# Patient Record
Sex: Female | Born: 1940 | Race: White | Hispanic: No | Marital: Single
Health system: Southern US, Community
[De-identification: ages and names within clinical notes are randomized; demographics above are authoritative.]

## PROBLEM LIST (undated history)

## (undated) DIAGNOSIS — Z930 Tracheostomy status: Secondary | ICD-10-CM

## (undated) DIAGNOSIS — R609 Edema, unspecified: Secondary | ICD-10-CM

## (undated) DIAGNOSIS — D62 Acute posthemorrhagic anemia: Secondary | ICD-10-CM

## (undated) DIAGNOSIS — Z931 Gastrostomy status: Secondary | ICD-10-CM

## (undated) DIAGNOSIS — I5030 Unspecified diastolic (congestive) heart failure: Secondary | ICD-10-CM

## (undated) DIAGNOSIS — I4891 Unspecified atrial fibrillation: Secondary | ICD-10-CM

## (undated) DIAGNOSIS — A419 Sepsis, unspecified organism: Secondary | ICD-10-CM

## (undated) DIAGNOSIS — R5381 Other malaise: Secondary | ICD-10-CM

## (undated) DIAGNOSIS — N189 Chronic kidney disease, unspecified: Secondary | ICD-10-CM

## (undated) DIAGNOSIS — I2510 Atherosclerotic heart disease of native coronary artery without angina pectoris: Secondary | ICD-10-CM

## (undated) DIAGNOSIS — S32009A Unspecified fracture of unspecified lumbar vertebra, initial encounter for closed fracture: Secondary | ICD-10-CM

## (undated) DIAGNOSIS — I219 Acute myocardial infarction, unspecified: Secondary | ICD-10-CM

## (undated) DIAGNOSIS — I10 Essential (primary) hypertension: Secondary | ICD-10-CM

## (undated) DIAGNOSIS — J9620 Acute and chronic respiratory failure, unspecified whether with hypoxia or hypercapnia: Secondary | ICD-10-CM

## (undated) DIAGNOSIS — S301XXA Contusion of abdominal wall, initial encounter: Secondary | ICD-10-CM

## (undated) DIAGNOSIS — J189 Pneumonia, unspecified organism: Secondary | ICD-10-CM

## (undated) DIAGNOSIS — M4850XA Collapsed vertebra, not elsewhere classified, site unspecified, initial encounter for fracture: Secondary | ICD-10-CM

## (undated) DIAGNOSIS — J95821 Acute postprocedural respiratory failure: Secondary | ICD-10-CM

## (undated) DIAGNOSIS — E870 Hyperosmolality and hypernatremia: Secondary | ICD-10-CM

## (undated) DIAGNOSIS — R531 Weakness: Secondary | ICD-10-CM

## (undated) DIAGNOSIS — A0472 Enterocolitis due to Clostridium difficile, not specified as recurrent: Secondary | ICD-10-CM

## (undated) HISTORY — PX: OPEN REDUCTION OF HIP: SHX5993

## (undated) HISTORY — PX: OTHER SURGICAL HISTORY: SHX169

---

## 2014-08-23 ENCOUNTER — Inpatient Hospital Stay (HOSPITAL_COMMUNITY)
Admission: EM | Admit: 2014-08-23 | Discharge: 2014-09-18 | DRG: 870 | Disposition: E | Payer: Medicare Other | Source: Other Acute Inpatient Hospital | Attending: Internal Medicine | Admitting: Internal Medicine

## 2014-08-23 ENCOUNTER — Emergency Department (HOSPITAL_COMMUNITY): Payer: Medicare Other

## 2014-08-23 DIAGNOSIS — I2510 Atherosclerotic heart disease of native coronary artery without angina pectoris: Secondary | ICD-10-CM | POA: Diagnosis present

## 2014-08-23 DIAGNOSIS — R739 Hyperglycemia, unspecified: Secondary | ICD-10-CM | POA: Diagnosis not present

## 2014-08-23 DIAGNOSIS — R579 Shock, unspecified: Secondary | ICD-10-CM

## 2014-08-23 DIAGNOSIS — Z87891 Personal history of nicotine dependence: Secondary | ICD-10-CM

## 2014-08-23 DIAGNOSIS — E46 Unspecified protein-calorie malnutrition: Secondary | ICD-10-CM | POA: Diagnosis not present

## 2014-08-23 DIAGNOSIS — I252 Old myocardial infarction: Secondary | ICD-10-CM | POA: Diagnosis not present

## 2014-08-23 DIAGNOSIS — Z931 Gastrostomy status: Secondary | ICD-10-CM

## 2014-08-23 DIAGNOSIS — J9621 Acute and chronic respiratory failure with hypoxia: Secondary | ICD-10-CM | POA: Diagnosis not present

## 2014-08-23 DIAGNOSIS — F0781 Postconcussional syndrome: Secondary | ICD-10-CM

## 2014-08-23 DIAGNOSIS — R5381 Other malaise: Secondary | ICD-10-CM | POA: Diagnosis present

## 2014-08-23 DIAGNOSIS — N179 Acute kidney failure, unspecified: Secondary | ICD-10-CM | POA: Diagnosis not present

## 2014-08-23 DIAGNOSIS — Z85118 Personal history of other malignant neoplasm of bronchus and lung: Secondary | ICD-10-CM

## 2014-08-23 DIAGNOSIS — Z23 Encounter for immunization: Secondary | ICD-10-CM | POA: Diagnosis not present

## 2014-08-23 DIAGNOSIS — L89010 Pressure ulcer of right elbow, unstageable: Secondary | ICD-10-CM | POA: Diagnosis not present

## 2014-08-23 DIAGNOSIS — E872 Acidosis: Secondary | ICD-10-CM | POA: Diagnosis not present

## 2014-08-23 DIAGNOSIS — I5032 Chronic diastolic (congestive) heart failure: Secondary | ICD-10-CM | POA: Diagnosis not present

## 2014-08-23 DIAGNOSIS — Z1624 Resistance to multiple antibiotics: Secondary | ICD-10-CM | POA: Diagnosis not present

## 2014-08-23 DIAGNOSIS — Z930 Tracheostomy status: Secondary | ICD-10-CM

## 2014-08-23 DIAGNOSIS — E876 Hypokalemia: Secondary | ICD-10-CM | POA: Diagnosis not present

## 2014-08-23 DIAGNOSIS — A419 Sepsis, unspecified organism: Secondary | ICD-10-CM | POA: Diagnosis not present

## 2014-08-23 DIAGNOSIS — E870 Hyperosmolality and hypernatremia: Secondary | ICD-10-CM | POA: Diagnosis not present

## 2014-08-23 DIAGNOSIS — N189 Chronic kidney disease, unspecified: Secondary | ICD-10-CM | POA: Diagnosis not present

## 2014-08-23 DIAGNOSIS — I129 Hypertensive chronic kidney disease with stage 1 through stage 4 chronic kidney disease, or unspecified chronic kidney disease: Secondary | ICD-10-CM | POA: Diagnosis not present

## 2014-08-23 DIAGNOSIS — Z7982 Long term (current) use of aspirin: Secondary | ICD-10-CM | POA: Diagnosis not present

## 2014-08-23 DIAGNOSIS — R6521 Severe sepsis with septic shock: Secondary | ICD-10-CM | POA: Diagnosis not present

## 2014-08-23 DIAGNOSIS — J9620 Acute and chronic respiratory failure, unspecified whether with hypoxia or hypercapnia: Secondary | ICD-10-CM

## 2014-08-23 DIAGNOSIS — J189 Pneumonia, unspecified organism: Secondary | ICD-10-CM

## 2014-08-23 DIAGNOSIS — A498 Other bacterial infections of unspecified site: Secondary | ICD-10-CM

## 2014-08-23 DIAGNOSIS — Z515 Encounter for palliative care: Secondary | ICD-10-CM | POA: Diagnosis not present

## 2014-08-23 DIAGNOSIS — Z79899 Other long term (current) drug therapy: Secondary | ICD-10-CM | POA: Diagnosis not present

## 2014-08-23 DIAGNOSIS — A499 Bacterial infection, unspecified: Secondary | ICD-10-CM

## 2014-08-23 DIAGNOSIS — Z9911 Dependence on respirator [ventilator] status: Secondary | ICD-10-CM | POA: Diagnosis not present

## 2014-08-23 DIAGNOSIS — Z6841 Body Mass Index (BMI) 40.0 and over, adult: Secondary | ICD-10-CM

## 2014-08-23 DIAGNOSIS — B9689 Other specified bacterial agents as the cause of diseases classified elsewhere: Secondary | ICD-10-CM | POA: Diagnosis present

## 2014-08-23 DIAGNOSIS — J449 Chronic obstructive pulmonary disease, unspecified: Secondary | ICD-10-CM | POA: Diagnosis not present

## 2014-08-23 DIAGNOSIS — E875 Hyperkalemia: Secondary | ICD-10-CM | POA: Diagnosis present

## 2014-08-23 DIAGNOSIS — R6520 Severe sepsis without septic shock: Secondary | ICD-10-CM | POA: Diagnosis present

## 2014-08-23 DIAGNOSIS — Z66 Do not resuscitate: Secondary | ICD-10-CM

## 2014-08-23 DIAGNOSIS — I4891 Unspecified atrial fibrillation: Secondary | ICD-10-CM | POA: Diagnosis present

## 2014-08-23 DIAGNOSIS — R042 Hemoptysis: Secondary | ICD-10-CM

## 2014-08-23 DIAGNOSIS — D638 Anemia in other chronic diseases classified elsewhere: Secondary | ICD-10-CM | POA: Diagnosis not present

## 2014-08-23 DIAGNOSIS — N170 Acute kidney failure with tubular necrosis: Secondary | ICD-10-CM

## 2014-08-23 DIAGNOSIS — R0602 Shortness of breath: Secondary | ICD-10-CM | POA: Diagnosis not present

## 2014-08-23 HISTORY — DX: Chronic kidney disease, unspecified: N18.9

## 2014-08-23 HISTORY — DX: Edema, unspecified: R60.9

## 2014-08-23 HISTORY — DX: Weakness: R53.1

## 2014-08-23 HISTORY — DX: Unspecified atrial fibrillation: I48.91

## 2014-08-23 HISTORY — DX: Acute and chronic respiratory failure, unspecified whether with hypoxia or hypercapnia: J96.20

## 2014-08-23 HISTORY — DX: Atherosclerotic heart disease of native coronary artery without angina pectoris: I25.10

## 2014-08-23 HISTORY — DX: Unspecified fracture of unspecified lumbar vertebra, initial encounter for closed fracture: S32.009A

## 2014-08-23 HISTORY — DX: Sepsis, unspecified organism: A41.9

## 2014-08-23 HISTORY — DX: Collapsed vertebra, not elsewhere classified, site unspecified, initial encounter for fracture: M48.50XA

## 2014-08-23 HISTORY — DX: Acute postprocedural respiratory failure: J95.821

## 2014-08-23 HISTORY — DX: Unspecified diastolic (congestive) heart failure: I50.30

## 2014-08-23 HISTORY — DX: Essential (primary) hypertension: I10

## 2014-08-23 HISTORY — DX: Acute myocardial infarction, unspecified: I21.9

## 2014-08-23 HISTORY — DX: Gastrostomy status: Z93.1

## 2014-08-23 HISTORY — DX: Contusion of abdominal wall, initial encounter: S30.1XXA

## 2014-08-23 HISTORY — DX: Hyperosmolality and hypernatremia: E87.0

## 2014-08-23 HISTORY — DX: Pneumonia, unspecified organism: J18.9

## 2014-08-23 HISTORY — DX: Tracheostomy status: Z93.0

## 2014-08-23 HISTORY — DX: Enterocolitis due to Clostridium difficile, not specified as recurrent: A04.72

## 2014-08-23 HISTORY — DX: Other malaise: R53.81

## 2014-08-23 HISTORY — DX: Acute posthemorrhagic anemia: D62

## 2014-08-23 MED ORDER — SODIUM CHLORIDE 0.9 % IV BOLUS (SEPSIS)
30.0000 mL/kg | Freq: Once | INTRAVENOUS | Status: AC
  Administered 2014-08-24: 3490 mL via INTRAVENOUS

## 2014-08-23 MED ORDER — VANCOMYCIN HCL 10 G IV SOLR
1500.0000 mg | Freq: Once | INTRAVENOUS | Status: AC
  Administered 2014-08-24: 1500 mg via INTRAVENOUS
  Filled 2014-08-23: qty 1500

## 2014-08-23 MED ORDER — SODIUM CHLORIDE 0.9 % IV SOLN: 1000.0000 mL | INTRAVENOUS | Status: DC

## 2014-08-23 MED ORDER — PIPERACILLIN-TAZOBACTAM 3.375 G IVPB 30 MIN
3.3750 g | Freq: Once | INTRAVENOUS | Status: AC
  Administered 2014-08-24: 3.375 g via INTRAVENOUS
  Filled 2014-08-23: qty 50

## 2014-08-24 ENCOUNTER — Encounter (HOSPITAL_COMMUNITY): Payer: Self-pay | Admitting: Emergency Medicine

## 2014-08-24 ENCOUNTER — Inpatient Hospital Stay (HOSPITAL_COMMUNITY): Payer: Medicare Other

## 2014-08-24 DIAGNOSIS — R6521 Severe sepsis with septic shock: Secondary | ICD-10-CM | POA: Diagnosis present

## 2014-08-24 DIAGNOSIS — Z931 Gastrostomy status: Secondary | ICD-10-CM | POA: Diagnosis not present

## 2014-08-24 DIAGNOSIS — Z515 Encounter for palliative care: Secondary | ICD-10-CM | POA: Diagnosis not present

## 2014-08-24 DIAGNOSIS — J9621 Acute and chronic respiratory failure with hypoxia: Secondary | ICD-10-CM | POA: Diagnosis present

## 2014-08-24 DIAGNOSIS — Z1624 Resistance to multiple antibiotics: Secondary | ICD-10-CM

## 2014-08-24 DIAGNOSIS — R579 Shock, unspecified: Secondary | ICD-10-CM

## 2014-08-24 DIAGNOSIS — N179 Acute kidney failure, unspecified: Secondary | ICD-10-CM

## 2014-08-24 DIAGNOSIS — A499 Bacterial infection, unspecified: Secondary | ICD-10-CM

## 2014-08-24 DIAGNOSIS — E872 Acidosis: Secondary | ICD-10-CM | POA: Diagnosis present

## 2014-08-24 DIAGNOSIS — R739 Hyperglycemia, unspecified: Secondary | ICD-10-CM | POA: Diagnosis present

## 2014-08-24 DIAGNOSIS — F0781 Postconcussional syndrome: Secondary | ICD-10-CM | POA: Diagnosis present

## 2014-08-24 DIAGNOSIS — I252 Old myocardial infarction: Secondary | ICD-10-CM | POA: Diagnosis not present

## 2014-08-24 DIAGNOSIS — Z66 Do not resuscitate: Secondary | ICD-10-CM

## 2014-08-24 DIAGNOSIS — I2510 Atherosclerotic heart disease of native coronary artery without angina pectoris: Secondary | ICD-10-CM | POA: Diagnosis present

## 2014-08-24 DIAGNOSIS — L89010 Pressure ulcer of right elbow, unstageable: Secondary | ICD-10-CM | POA: Diagnosis present

## 2014-08-24 DIAGNOSIS — Z23 Encounter for immunization: Secondary | ICD-10-CM | POA: Diagnosis not present

## 2014-08-24 DIAGNOSIS — I5032 Chronic diastolic (congestive) heart failure: Secondary | ICD-10-CM | POA: Diagnosis present

## 2014-08-24 DIAGNOSIS — J9620 Acute and chronic respiratory failure, unspecified whether with hypoxia or hypercapnia: Secondary | ICD-10-CM

## 2014-08-24 DIAGNOSIS — J189 Pneumonia, unspecified organism: Secondary | ICD-10-CM | POA: Diagnosis present

## 2014-08-24 DIAGNOSIS — Z6841 Body Mass Index (BMI) 40.0 and over, adult: Secondary | ICD-10-CM | POA: Diagnosis not present

## 2014-08-24 DIAGNOSIS — Z79899 Other long term (current) drug therapy: Secondary | ICD-10-CM | POA: Diagnosis not present

## 2014-08-24 DIAGNOSIS — Z85118 Personal history of other malignant neoplasm of bronchus and lung: Secondary | ICD-10-CM | POA: Diagnosis not present

## 2014-08-24 DIAGNOSIS — I4891 Unspecified atrial fibrillation: Secondary | ICD-10-CM | POA: Diagnosis present

## 2014-08-24 DIAGNOSIS — N189 Chronic kidney disease, unspecified: Secondary | ICD-10-CM | POA: Diagnosis present

## 2014-08-24 DIAGNOSIS — Z87891 Personal history of nicotine dependence: Secondary | ICD-10-CM | POA: Diagnosis not present

## 2014-08-24 DIAGNOSIS — Z930 Tracheostomy status: Secondary | ICD-10-CM

## 2014-08-24 DIAGNOSIS — D638 Anemia in other chronic diseases classified elsewhere: Secondary | ICD-10-CM | POA: Diagnosis present

## 2014-08-24 DIAGNOSIS — R0602 Shortness of breath: Secondary | ICD-10-CM | POA: Diagnosis present

## 2014-08-24 DIAGNOSIS — E876 Hypokalemia: Secondary | ICD-10-CM | POA: Diagnosis not present

## 2014-08-24 DIAGNOSIS — E875 Hyperkalemia: Secondary | ICD-10-CM | POA: Diagnosis present

## 2014-08-24 DIAGNOSIS — J449 Chronic obstructive pulmonary disease, unspecified: Secondary | ICD-10-CM | POA: Diagnosis present

## 2014-08-24 DIAGNOSIS — E870 Hyperosmolality and hypernatremia: Secondary | ICD-10-CM | POA: Diagnosis not present

## 2014-08-24 DIAGNOSIS — R5381 Other malaise: Secondary | ICD-10-CM | POA: Diagnosis present

## 2014-08-24 DIAGNOSIS — B9689 Other specified bacterial agents as the cause of diseases classified elsewhere: Secondary | ICD-10-CM | POA: Diagnosis present

## 2014-08-24 DIAGNOSIS — Z7982 Long term (current) use of aspirin: Secondary | ICD-10-CM | POA: Diagnosis not present

## 2014-08-24 DIAGNOSIS — A419 Sepsis, unspecified organism: Secondary | ICD-10-CM | POA: Diagnosis present

## 2014-08-24 DIAGNOSIS — Z9911 Dependence on respirator [ventilator] status: Secondary | ICD-10-CM | POA: Diagnosis not present

## 2014-08-24 DIAGNOSIS — I129 Hypertensive chronic kidney disease with stage 1 through stage 4 chronic kidney disease, or unspecified chronic kidney disease: Secondary | ICD-10-CM | POA: Diagnosis present

## 2014-08-24 DIAGNOSIS — E46 Unspecified protein-calorie malnutrition: Secondary | ICD-10-CM | POA: Diagnosis present

## 2014-08-24 DIAGNOSIS — R6520 Severe sepsis without septic shock: Secondary | ICD-10-CM

## 2014-08-24 LAB — I-STAT ARTERIAL BLOOD GAS, ED
Acid-Base Excess: 1 mmol/L (ref 0.0–2.0)
Acid-Base Excess: 1 mmol/L (ref 0.0–2.0)
Acid-base deficit: 1 mmol/L (ref 0.0–2.0)
Bicarbonate: 26.4 mEq/L — ABNORMAL HIGH (ref 20.0–24.0)
Bicarbonate: 29.6 mEq/L — ABNORMAL HIGH (ref 20.0–24.0)
Bicarbonate: 30.1 mEq/L — ABNORMAL HIGH (ref 20.0–24.0)
Bicarbonate: 30.5 mEq/L — ABNORMAL HIGH (ref 20.0–24.0)
O2 SAT: 66 %
O2 SAT: 72 %
O2 SAT: 83 %
O2 Saturation: 89 %
PCO2 ART: 67.3 mmHg — AB (ref 35.0–45.0)
PH ART: 7.251 — AB (ref 7.350–7.450)
PH ART: 7.258 — AB (ref 7.350–7.450)
PO2 ART: 56 mmHg — AB (ref 80.0–100.0)
Patient temperature: 98.7
Patient temperature: 98.7
Patient temperature: 98.7
TCO2: 28 mmol/L (ref 0–100)
TCO2: 32 mmol/L (ref 0–100)
TCO2: 32 mmol/L (ref 0–100)
TCO2: 33 mmol/L (ref 0–100)
pCO2 arterial: 59.1 mmHg (ref 35.0–45.0)
pCO2 arterial: 74.3 mmHg (ref 35.0–45.0)
pCO2 arterial: 85.2 mmHg (ref 35.0–45.0)
pH, Arterial: 7.162 — CL (ref 7.350–7.450)
pH, Arterial: 7.216 — ABNORMAL LOW (ref 7.350–7.450)
pO2, Arterial: 46 mmHg — ABNORMAL LOW (ref 80.0–100.0)
pO2, Arterial: 48 mmHg — ABNORMAL LOW (ref 80.0–100.0)
pO2, Arterial: 67 mmHg — ABNORMAL LOW (ref 80.0–100.0)

## 2014-08-24 LAB — BASIC METABOLIC PANEL
ANION GAP: 15 (ref 5–15)
ANION GAP: 17 — AB (ref 5–15)
Anion gap: 14 (ref 5–15)
BUN: 102 mg/dL — ABNORMAL HIGH (ref 6–23)
BUN: 102 mg/dL — ABNORMAL HIGH (ref 6–23)
BUN: 104 mg/dL — ABNORMAL HIGH (ref 6–23)
CALCIUM: 8.6 mg/dL (ref 8.4–10.5)
CHLORIDE: 93 meq/L — AB (ref 96–112)
CHLORIDE: 95 meq/L — AB (ref 96–112)
CO2: 25 mEq/L (ref 19–32)
CO2: 25 mEq/L (ref 19–32)
CO2: 25 mEq/L (ref 19–32)
CREATININE: 1.56 mg/dL — AB (ref 0.50–1.10)
CREATININE: 1.62 mg/dL — AB (ref 0.50–1.10)
Calcium: 8.2 mg/dL — ABNORMAL LOW (ref 8.4–10.5)
Calcium: 8.3 mg/dL — ABNORMAL LOW (ref 8.4–10.5)
Chloride: 93 mEq/L — ABNORMAL LOW (ref 96–112)
Creatinine, Ser: 1.56 mg/dL — ABNORMAL HIGH (ref 0.50–1.10)
GFR calc Af Amer: 35 mL/min — ABNORMAL LOW (ref >90)
GFR calc non Af Amer: 30 mL/min — ABNORMAL LOW (ref >90)
GFR, EST AFRICAN AMERICAN: 37 mL/min — AB (ref >90)
GFR, EST AFRICAN AMERICAN: 37 mL/min — AB (ref >90)
GFR, EST NON AFRICAN AMERICAN: 32 mL/min — AB (ref >90)
GFR, EST NON AFRICAN AMERICAN: 32 mL/min — AB (ref >90)
Glucose, Bld: 106 mg/dL — ABNORMAL HIGH (ref 70–99)
Glucose, Bld: 107 mg/dL — ABNORMAL HIGH (ref 70–99)
Glucose, Bld: 137 mg/dL — ABNORMAL HIGH (ref 70–99)
POTASSIUM: 5.9 meq/L — AB (ref 3.7–5.3)
Potassium: 5.9 mEq/L — ABNORMAL HIGH (ref 3.7–5.3)
Potassium: 5.6 mEq/L — ABNORMAL HIGH (ref 3.7–5.3)
SODIUM: 133 meq/L — AB (ref 137–147)
Sodium: 132 mEq/L — ABNORMAL LOW (ref 137–147)
Sodium: 137 mEq/L (ref 137–147)

## 2014-08-24 LAB — CBC WITH DIFFERENTIAL/PLATELET
BASOS PCT: 0 % (ref 0–1)
Basophils Absolute: 0 10*3/uL (ref 0.0–0.1)
EOS ABS: 0 10*3/uL (ref 0.0–0.7)
Eosinophils Relative: 0 % (ref 0–5)
HEMATOCRIT: 22 % — AB (ref 36.0–46.0)
HEMOGLOBIN: 7.1 g/dL — AB (ref 12.0–15.0)
LYMPHS ABS: 0 10*3/uL — AB (ref 0.7–4.0)
LYMPHS PCT: 0 % — AB (ref 12–46)
MCH: 29.3 pg (ref 26.0–34.0)
MCHC: 32.3 g/dL (ref 30.0–36.0)
MCV: 90.9 fL (ref 78.0–100.0)
MONO ABS: 0.2 10*3/uL (ref 0.1–1.0)
Monocytes Relative: 1 % — ABNORMAL LOW (ref 3–12)
NEUTROS ABS: 22.9 10*3/uL — AB (ref 1.7–7.7)
Neutrophils Relative %: 99 % — ABNORMAL HIGH (ref 43–77)
Platelets: 186 10*3/uL (ref 150–400)
RBC: 2.42 MIL/uL — ABNORMAL LOW (ref 3.87–5.11)
RDW: 17.4 % — ABNORMAL HIGH (ref 11.5–15.5)
WBC Morphology: INCREASED
WBC: 23.1 10*3/uL — ABNORMAL HIGH (ref 4.0–10.5)

## 2014-08-24 LAB — URINE MICROSCOPIC-ADD ON

## 2014-08-24 LAB — URINALYSIS, ROUTINE W REFLEX MICROSCOPIC
BILIRUBIN URINE: NEGATIVE
Glucose, UA: NEGATIVE mg/dL
Ketones, ur: NEGATIVE mg/dL
Nitrite: NEGATIVE
PROTEIN: NEGATIVE mg/dL
Specific Gravity, Urine: 1.011 (ref 1.005–1.030)
UROBILINOGEN UA: 0.2 mg/dL (ref 0.0–1.0)
pH: 5 (ref 5.0–8.0)

## 2014-08-24 LAB — CBC
HCT: 20.3 % — ABNORMAL LOW (ref 36.0–46.0)
Hemoglobin: 6.5 g/dL — CL (ref 12.0–15.0)
MCH: 28.5 pg (ref 26.0–34.0)
MCHC: 32 g/dL (ref 30.0–36.0)
MCV: 89 fL (ref 78.0–100.0)
Platelets: 139 10*3/uL — ABNORMAL LOW (ref 150–400)
RBC: 2.28 MIL/uL — ABNORMAL LOW (ref 3.87–5.11)
RDW: 17.4 % — AB (ref 11.5–15.5)
WBC: 18.8 10*3/uL — AB (ref 4.0–10.5)

## 2014-08-24 LAB — PHOSPHORUS: PHOSPHORUS: 5.8 mg/dL — AB (ref 2.3–4.6)

## 2014-08-24 LAB — MAGNESIUM: Magnesium: 2.2 mg/dL (ref 1.5–2.5)

## 2014-08-24 LAB — GLUCOSE, CAPILLARY
GLUCOSE-CAPILLARY: 98 mg/dL (ref 70–99)
GLUCOSE-CAPILLARY: 136 mg/dL — AB (ref 70–99)
GLUCOSE-CAPILLARY: 134 mg/dL — AB (ref 70–99)
Glucose-Capillary: 108 mg/dL — ABNORMAL HIGH (ref 70–99)
Glucose-Capillary: 153 mg/dL — ABNORMAL HIGH (ref 70–99)

## 2014-08-24 LAB — TYPE AND SCREEN
ABO/RH(D): O POS
Antibody Screen: POSITIVE
DAT, IgG: NEGATIVE

## 2014-08-24 LAB — COMPREHENSIVE METABOLIC PANEL
ALT: 46 U/L — ABNORMAL HIGH (ref 0–35)
ANION GAP: 16 — AB (ref 5–15)
AST: 50 U/L — ABNORMAL HIGH (ref 0–37)
Albumin: 1.5 g/dL — ABNORMAL LOW (ref 3.5–5.2)
Alkaline Phosphatase: 331 U/L — ABNORMAL HIGH (ref 39–117)
BUN: 105 mg/dL — AB (ref 6–23)
CO2: 25 mEq/L (ref 19–32)
CREATININE: 1.59 mg/dL — AB (ref 0.50–1.10)
Calcium: 8.8 mg/dL (ref 8.4–10.5)
Chloride: 91 mEq/L — ABNORMAL LOW (ref 96–112)
GFR calc non Af Amer: 31 mL/min — ABNORMAL LOW (ref >90)
GFR, EST AFRICAN AMERICAN: 36 mL/min — AB (ref >90)
Glucose, Bld: 100 mg/dL — ABNORMAL HIGH (ref 70–99)
POTASSIUM: 6.1 meq/L — AB (ref 3.7–5.3)
Sodium: 132 mEq/L — ABNORMAL LOW (ref 137–147)
TOTAL PROTEIN: 5.9 g/dL — AB (ref 6.0–8.3)
Total Bilirubin: 0.6 mg/dL (ref 0.3–1.2)

## 2014-08-24 LAB — TROPONIN I: Troponin I: <0.3 ng/mL (ref <0.30)

## 2014-08-24 LAB — PRO B NATRIURETIC PEPTIDE: Pro B Natriuretic peptide (BNP): 60316 pg/mL — ABNORMAL HIGH (ref 0–125)

## 2014-08-24 LAB — EXPECTORATED SPUTUM ASSESSMENT W GRAM STAIN, RFLX TO RESP C

## 2014-08-24 LAB — I-STAT CG4 LACTIC ACID, ED: Lactic Acid, Venous: 2.21 mmol/L — ABNORMAL HIGH (ref 0.5–2.2)

## 2014-08-24 LAB — STREP PNEUMONIAE URINARY ANTIGEN: Strep Pneumo Urinary Antigen: NEGATIVE

## 2014-08-24 LAB — CORTISOL: Cortisol, Plasma: 34 ug/dL

## 2014-08-24 LAB — PROCALCITONIN: Procalcitonin: 37.77 ng/mL

## 2014-08-24 LAB — PROTIME-INR
INR: 1.31 (ref 0.00–1.49)
Prothrombin Time: 16.3 seconds — ABNORMAL HIGH (ref 11.6–15.2)

## 2014-08-24 LAB — PREPARE RBC (CROSSMATCH)

## 2014-08-24 LAB — EXPECTORATED SPUTUM ASSESSMENT W REFEX TO RESP CULTURE: SPECIAL REQUESTS: NORMAL

## 2014-08-24 LAB — MRSA PCR SCREENING: MRSA by PCR: POSITIVE — AB

## 2014-08-24 MED ORDER — IPRATROPIUM-ALBUTEROL 0.5-2.5 (3) MG/3ML IN SOLN
3.0000 mL | Freq: Four times a day (QID) | RESPIRATORY_TRACT | Status: DC
  Administered 2014-08-24 – 2014-08-29 (×23): 3 mL via RESPIRATORY_TRACT
  Filled 2014-08-24 (×24): qty 3

## 2014-08-24 MED ORDER — ALBUTEROL (5 MG/ML) CONTINUOUS INHALATION SOLN: 10.0000 mg/h | INHALATION_SOLUTION | RESPIRATORY_TRACT | Status: DC

## 2014-08-24 MED ORDER — IPRATROPIUM-ALBUTEROL 0.5-2.5 (3) MG/3ML IN SOLN
3.0000 mL | RESPIRATORY_TRACT | Status: DC
  Administered 2014-08-24: 3 mL via RESPIRATORY_TRACT
  Filled 2014-08-24: qty 3

## 2014-08-24 MED ORDER — SODIUM CHLORIDE 0.9 % IV SOLN
INTRAVENOUS | Status: DC
  Administered 2014-08-26 – 2014-08-28 (×3): via INTRAVENOUS

## 2014-08-24 MED ORDER — SODIUM CHLORIDE 0.9 % IV SOLN: 250.0000 mL | INTRAVENOUS | Status: DC | PRN

## 2014-08-24 MED ORDER — VANCOMYCIN HCL 10 G IV SOLR
1250.0000 mg | INTRAVENOUS | Status: DC
  Administered 2014-08-25 – 2014-08-27 (×3): 1250 mg via INTRAVENOUS
  Filled 2014-08-24 (×4): qty 1250

## 2014-08-24 MED ORDER — CHLORHEXIDINE GLUCONATE 0.12 % MT SOLN
15.0000 mL | Freq: Two times a day (BID) | OROMUCOSAL | Status: DC
  Administered 2014-08-24 – 2014-08-29 (×11): 15 mL via OROMUCOSAL
  Filled 2014-08-24 (×10): qty 15

## 2014-08-24 MED ORDER — METHYLPREDNISOLONE SODIUM SUCC 125 MG IJ SOLR
60.0000 mg | Freq: Four times a day (QID) | INTRAMUSCULAR | Status: DC
  Administered 2014-08-24 – 2014-08-26 (×10): 60 mg via INTRAVENOUS
  Filled 2014-08-24 (×2): qty 0.96
  Filled 2014-08-24: qty 2
  Filled 2014-08-24 (×10): qty 0.96
  Filled 2014-08-24: qty 2
  Filled 2014-08-24: qty 0.96

## 2014-08-24 MED ORDER — SODIUM POLYSTYRENE SULFONATE 15 GM/60ML PO SUSP
45.0000 g | Freq: Once | ORAL | Status: AC
  Administered 2014-08-24: 45 g
  Filled 2014-08-24: qty 180

## 2014-08-24 MED ORDER — COLLAGENASE 250 UNIT/GM EX OINT
TOPICAL_OINTMENT | Freq: Every day | CUTANEOUS | Status: DC
  Administered 2014-08-25 – 2014-08-28 (×4): via TOPICAL
  Filled 2014-08-24 (×2): qty 30

## 2014-08-24 MED ORDER — PIPERACILLIN-TAZOBACTAM 3.375 G IVPB
3.3750 g | Freq: Three times a day (TID) | INTRAVENOUS | Status: DC
  Administered 2014-08-24 – 2014-08-25 (×4): 3.375 g via INTRAVENOUS
  Filled 2014-08-24 (×5): qty 50

## 2014-08-24 MED ORDER — FENTANYL CITRATE 0.05 MG/ML IJ SOLN
25.0000 ug | Freq: Once | INTRAMUSCULAR | Status: AC
  Administered 2014-08-25: 25 ug via INTRAVENOUS
  Filled 2014-08-24: qty 2

## 2014-08-24 MED ORDER — CETYLPYRIDINIUM CHLORIDE 0.05 % MT LIQD
7.0000 mL | Freq: Four times a day (QID) | OROMUCOSAL | Status: DC
  Administered 2014-08-24 – 2014-08-29 (×21): 7 mL via OROMUCOSAL

## 2014-08-24 MED ORDER — DEXTROSE 5 % IV SOLN
2.0000 ug/min | INTRAVENOUS | Status: DC
  Administered 2014-08-24: 2 ug/min via INTRAVENOUS
  Filled 2014-08-24 (×2): qty 4

## 2014-08-24 MED ORDER — INSULIN ASPART 100 UNIT/ML ~~LOC~~ SOLN
2.0000 [IU] | SUBCUTANEOUS | Status: DC
  Administered 2014-08-24: 2 [IU] via SUBCUTANEOUS
  Administered 2014-08-24: 4 [IU] via SUBCUTANEOUS
  Administered 2014-08-25 (×4): 2 [IU] via SUBCUTANEOUS
  Administered 2014-08-25 – 2014-08-26 (×3): 4 [IU] via SUBCUTANEOUS
  Administered 2014-08-26: 2 [IU] via SUBCUTANEOUS
  Administered 2014-08-26: 4 [IU] via SUBCUTANEOUS
  Administered 2014-08-26 (×2): 6 [IU] via SUBCUTANEOUS
  Administered 2014-08-26: 4 [IU] via SUBCUTANEOUS
  Administered 2014-08-26: 2 [IU] via SUBCUTANEOUS

## 2014-08-24 MED ORDER — SODIUM POLYSTYRENE SULFONATE 15 GM/60ML PO SUSP
30.0000 g | Freq: Once | ORAL | Status: AC
  Administered 2014-08-24: 30 g
  Filled 2014-08-24: qty 120

## 2014-08-24 MED ORDER — ALBUTEROL SULFATE (2.5 MG/3ML) 0.083% IN NEBU: 2.5000 mg | INHALATION_SOLUTION | RESPIRATORY_TRACT | Status: DC | PRN

## 2014-08-24 MED ORDER — FAMOTIDINE IN NACL 20-0.9 MG/50ML-% IV SOLN
20.0000 mg | Freq: Two times a day (BID) | INTRAVENOUS | Status: DC
  Administered 2014-08-24 – 2014-08-25 (×3): 20 mg via INTRAVENOUS
  Filled 2014-08-24 (×4): qty 50

## 2014-08-24 MED ORDER — DEXTROSE 5 % IV SOLN
500.0000 mg | Freq: Once | INTRAVENOUS | Status: AC
  Administered 2014-08-24: 500 mg via INTRAVENOUS
  Filled 2014-08-24: qty 500

## 2014-08-24 MED ORDER — DEXTROSE 5 % IV SOLN
500.0000 mg | INTRAVENOUS | Status: DC
  Administered 2014-08-24: 500 mg via INTRAVENOUS
  Filled 2014-08-24 (×2): qty 500

## 2014-08-24 MED ORDER — LEVOFLOXACIN IN D5W 750 MG/150ML IV SOLN: 750.0000 mg | Freq: Once | INTRAVENOUS | Status: DC

## 2014-08-24 MED ORDER — SODIUM CHLORIDE 0.9 % IV SOLN: Freq: Once | INTRAVENOUS | Status: DC

## 2014-08-24 NOTE — Progress Notes (Signed)
Utilization Review Completed.Lynda Capistran T10/05/2014  

## 2014-08-24 NOTE — ED Notes (Signed)
Critical care at bedside inserting arterial line
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
## 2014-08-24 NOTE — ED Notes (Signed)
Unable to obtain consents for blood products due to pt non communicative and vent dependent

## 2014-08-24 NOTE — Procedures (Signed)
Central Venous Catheter Insertion Procedure Note Colin MuldersMaxine Hilmes [id number] 09-21-1941  Procedure: Insertion of Central Venous Catheter Indications: Assessment of intravascular volume and Drug and/or fluid administration  Procedure Details Consent: Risks of procedure as well as the alternatives and risks of each were explained to the (patient/caregiver).  Consent for procedure obtained. Time Out: Verified patient identification, verified procedure, site/side was marked, verified correct patient position, special equipment/implants available, medications/allergies/relevent history reviewed, required imaging and test results available.  Performed  Maximum sterile technique was used including antiseptics, cap, gloves, gown, hand hygiene, mask and sheet. Skin prep: Chlorhexidine; local anesthetic administered A antimicrobial bonded/coated triple lumen catheter was placed in the left internal jugular vein using the Seldinger technique.  Evaluation Blood flow good Complications: No apparent complications Patient did tolerate procedure well. Chest X-ray ordered to verify placement.  CXR: pending.   Performed under direct MD supervision.  Performed using ultrasound guidance.  Wire visualized in vessel under ultrasound.   Dirk DressKaty Whiteheart, NP 08/24/2014  9:48 AM Pager: [phone] or [phone]   Levy Pupaobert Ahlijah Raia, MD, PhD 08/25/2014, 11:33 AM [facility] [phone] or if no answer [phone]

## 2014-08-24 NOTE — Progress Notes (Signed)
PCCM Interval Progress Note  Right femoral arterial line attempted.  Pt extremely edematous therefore ultrasound imaging poor.  Was still able to cannulate femoral artery; however, guidewire was unable to be passed and kinked despite multiple attempts.  If still needed in AM, day team to re-attempt possibly on left side.   Brandy Guysahul Giovanne Nickolson, PA - [facility] Pgr: [phone]  or [phone]
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
## 2014-08-24 NOTE — Progress Notes (Signed)
Critical ABG reported to Dr. Mora Bellmanni.

## 2014-08-24 NOTE — ED Notes (Signed)
LAb was made aware of type and cross order

## 2014-08-24 NOTE — ED Notes (Signed)
Critical hemoglobin of 6.5 called from lab. Awaiting blood

## 2014-08-24 NOTE — ED Notes (Signed)
Attempted to draw blood off central venous catheter ports. Blue port had no blood return and white port is clotted.

## 2014-08-24 NOTE — ED Notes (Signed)
Attempted report 

## 2014-08-24 NOTE — Progress Notes (Signed)
Pt transports to 2M07 without complication.

## 2014-08-24 NOTE — Progress Notes (Signed)
Upon arrival via [facility] pt found with Shiley #7cuffed EMT instructed pt vent settings per [facility]: PC 40, RR 30, Peep 10, FiO2 80%. Last ABG per [facility]: pH 7.32, Co2 51, PaO2 77, HCO3 26. Per Dr. Mora Bellmanni patient was placed on PRVC VT 400, RR 20, Peep 10, FiO2 100%. Pt's Sats were in mid 60's-70's. RT preformed recruitment maneuver per MD with no change. RT then removed patient from vent and bagged/lavaged and deep suctioned patient. Copious, think yellow/pink mucous plugs suctioned from trach. Sats improved to 97%. Pt returned to vent. RT to monitor.

## 2014-08-24 NOTE — ED Notes (Signed)
Rutherford Guysahul Desai, PA-C for critical care at bedside.

## 2014-08-24 NOTE — ED Notes (Signed)
MD was attempting Central Line placement prior to me arriving

## 2014-08-24 NOTE — Progress Notes (Signed)
Critical ABG reported to Dr. Oni. 
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
## 2014-08-25 ENCOUNTER — Inpatient Hospital Stay (HOSPITAL_COMMUNITY): Payer: Medicare Other

## 2014-08-25 DIAGNOSIS — I059 Rheumatic mitral valve disease, unspecified: Secondary | ICD-10-CM

## 2014-08-25 LAB — TYPE AND SCREEN
ABO/RH(D): O POS
Antibody Screen: POSITIVE
Unit division: 0
Unit division: 0

## 2014-08-25 LAB — PHOSPHORUS: Phosphorus: 6.3 mg/dL — ABNORMAL HIGH (ref 2.3–4.6)

## 2014-08-25 LAB — BASIC METABOLIC PANEL
Anion gap: 13 (ref 5–15)
BUN: 110 mg/dL — AB (ref 6–23)
CO2: 26 mEq/L (ref 19–32)
CREATININE: 1.68 mg/dL — AB (ref 0.50–1.10)
Calcium: 8.6 mg/dL (ref 8.4–10.5)
Chloride: 97 mEq/L (ref 96–112)
GFR, EST AFRICAN AMERICAN: 34 mL/min — AB (ref >90)
GFR, EST NON AFRICAN AMERICAN: 29 mL/min — AB (ref >90)
Glucose, Bld: 137 mg/dL — ABNORMAL HIGH (ref 70–99)
Potassium: 5.1 mEq/L (ref 3.7–5.3)
Sodium: 136 mEq/L — ABNORMAL LOW (ref 137–147)

## 2014-08-25 LAB — CLOSTRIDIUM DIFFICILE BY PCR: Toxigenic C. Difficile by PCR: NEGATIVE

## 2014-08-25 LAB — CBC WITH DIFFERENTIAL/PLATELET
BASOS ABS: 0 10*3/uL (ref 0.0–0.1)
BASOS PCT: 0 % (ref 0–1)
Eosinophils Absolute: 0 10*3/uL (ref 0.0–0.7)
Eosinophils Relative: 0 % (ref 0–5)
HEMATOCRIT: 25.4 % — AB (ref 36.0–46.0)
Hemoglobin: 8.3 g/dL — ABNORMAL LOW (ref 12.0–15.0)
Lymphocytes Relative: 2 % — ABNORMAL LOW (ref 12–46)
Lymphs Abs: 0.3 10*3/uL — ABNORMAL LOW (ref 0.7–4.0)
MCH: 29.3 pg (ref 26.0–34.0)
MCHC: 32.7 g/dL (ref 30.0–36.0)
MCV: 89.8 fL (ref 78.0–100.0)
MONO ABS: 0.3 10*3/uL (ref 0.1–1.0)
Monocytes Relative: 2 % — ABNORMAL LOW (ref 3–12)
Neutro Abs: 19 10*3/uL — ABNORMAL HIGH (ref 1.7–7.7)
Neutrophils Relative %: 97 % — ABNORMAL HIGH (ref 43–77)
Platelets: 127 10*3/uL — ABNORMAL LOW (ref 150–400)
RBC: 2.83 MIL/uL — ABNORMAL LOW (ref 3.87–5.11)
RDW: 17.9 % — AB (ref 11.5–15.5)
WBC: 19.6 10*3/uL — ABNORMAL HIGH (ref 4.0–10.5)

## 2014-08-25 LAB — LEGIONELLA ANTIGEN, URINE

## 2014-08-25 LAB — GLUCOSE, CAPILLARY
GLUCOSE-CAPILLARY: 133 mg/dL — AB (ref 70–99)
Glucose-Capillary: 125 mg/dL — ABNORMAL HIGH (ref 70–99)
Glucose-Capillary: 128 mg/dL — ABNORMAL HIGH (ref 70–99)
Glucose-Capillary: 145 mg/dL — ABNORMAL HIGH (ref 70–99)
Glucose-Capillary: 147 mg/dL — ABNORMAL HIGH (ref 70–99)
Glucose-Capillary: 191 mg/dL — ABNORMAL HIGH (ref 70–99)

## 2014-08-25 LAB — PROCALCITONIN: Procalcitonin: 47.34 ng/mL

## 2014-08-25 LAB — MAGNESIUM: Magnesium: 2.3 mg/dL (ref 1.5–2.5)

## 2014-08-25 MED ORDER — SEVELAMER CARBONATE 0.8 G PO PACK
0.8000 g | PACK | Freq: Three times a day (TID) | ORAL | Status: DC
  Administered 2014-08-25 – 2014-08-29 (×13): 0.8 g
  Filled 2014-08-25 (×15): qty 1

## 2014-08-25 MED ORDER — LEVOFLOXACIN IN D5W 750 MG/150ML IV SOLN
750.0000 mg | INTRAVENOUS | Status: DC
  Administered 2014-08-25: 750 mg via INTRAVENOUS
  Filled 2014-08-25: qty 150

## 2014-08-25 MED ORDER — FAMOTIDINE IN NACL 20-0.9 MG/50ML-% IV SOLN
20.0000 mg | INTRAVENOUS | Status: DC
  Administered 2014-08-26: 20 mg via INTRAVENOUS
  Filled 2014-08-25 (×2): qty 50

## 2014-08-25 MED ORDER — VITAL HIGH PROTEIN PO LIQD
1000.0000 mL | ORAL | Status: DC
  Administered 2014-08-25 – 2014-08-29 (×5): 1000 mL
  Administered 2014-08-29: 16:00:00
  Filled 2014-08-25 (×7): qty 1000

## 2014-08-25 MED ORDER — MUPIROCIN 2 % EX OINT
1.0000 "application " | TOPICAL_OINTMENT | Freq: Two times a day (BID) | CUTANEOUS | Status: DC
  Administered 2014-08-25 – 2014-08-29 (×9): 1 via NASAL
  Filled 2014-08-25 (×2): qty 22

## 2014-08-25 MED ORDER — CHLORHEXIDINE GLUCONATE CLOTH 2 % EX PADS
6.0000 | MEDICATED_PAD | Freq: Every day | CUTANEOUS | Status: AC
  Administered 2014-08-25 – 2014-08-29 (×5): 6 via TOPICAL

## 2014-08-25 MED ORDER — SODIUM CHLORIDE 0.9 % IV SOLN
1.0000 g | Freq: Two times a day (BID) | INTRAVENOUS | Status: DC
  Administered 2014-08-25 (×2): 1 g via INTRAVENOUS
  Filled 2014-08-25 (×4): qty 1

## 2014-08-25 MED ORDER — VITAL HIGH PROTEIN PO LIQD
1000.0000 mL | ORAL | Status: DC
  Filled 2014-08-25 (×2): qty 1000

## 2014-08-25 MED ORDER — PRO-STAT SUGAR FREE PO LIQD
30.0000 mL | Freq: Two times a day (BID) | ORAL | Status: DC
  Administered 2014-08-25 – 2014-08-29 (×9): 30 mL
  Filled 2014-08-25 (×10): qty 30

## 2014-08-25 NOTE — Progress Notes (Signed)
Elink MD notified regarding positive blood culture results.

## 2014-08-25 NOTE — Progress Notes (Signed)
  Echocardiogram 2D Echocardiogram has been performed.  Brandy Gonzalez, Brandy Gonzalez 08/25/2014, 12:21 PM

## 2014-08-25 NOTE — Progress Notes (Signed)
Trach consult completed at this time. Pt remains on full vent support. Per CSW notes, plan is for pt to be discharged back to [facility] when pt is ready. Will continue to follow for pt progress.

## 2014-08-25 NOTE — Trach Care Team (Signed)
Trach Care Progression Note   Patient Details Name: Colin MuldersMaxine Karbowski MRN: [id number] DOB: 12/25/1940 Today's Date: 08/25/2014   Tracheostomy Assessment    Tracheostomy Shiley 7 mm Cuffed (Active)  Status Secured 08/25/2014  1:42 PM  Site Assessment Clean;Dry 08/25/2014 12:42 PM  Site Care Cleansed;Dried;Dressing applied 09/09/2014 11:00 PM  Inner Cannula Care No inner cannula 08/25/2014 12:42 PM  Ties Assessment Clean;Dry;Secure 08/25/2014 12:42 PM  Cuff pressure (cm) 30 cm 08/25/2014  2:14 AM  Emergency Equipment at bedside Yes 08/25/2014  1:42 PM     Care Needs     Respiratory Therapy O2 Device: Ventilator FiO2 (%): 70 % SpO2: 100 % Chronic trach pt    Speech Language Pathology  SLP chart review complete: Patient does not need SLP services at this time   Physical Therapy      Occupational Therapy      Nutritional Patient's Current Diet: NPO    Case Management/Social Work  From [facility].    [facility] Recommendations        [facility] Members -  [place]nie DeBlois, SLP,  [place]ackary Brooks, [place], and Cherylin MylarLauren Doyle, [place]      SW consult for vent SNF          Arnett Duddy Anita(scribe for team) 08/25/2014, 2:32 PM

## 2014-08-25 NOTE — Clinical Social Work Note (Signed)
Clinical Social Worker left a message with pt's daughter listed in reference to post-acute discharge plans. [facility] CSW will continue to make contact with pt's daughter for discharge planning when pt is ready.   Derenda FennelBashira Jirah Rider, MSW, LCSWA [phone] 08/25/2014 3:32 PM
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
## 2014-08-26 ENCOUNTER — Inpatient Hospital Stay (HOSPITAL_COMMUNITY): Payer: Medicare Other

## 2014-08-26 LAB — GLUCOSE, CAPILLARY
GLUCOSE-CAPILLARY: 142 mg/dL — AB (ref 70–99)
GLUCOSE-CAPILLARY: 181 mg/dL — AB (ref 70–99)
GLUCOSE-CAPILLARY: 223 mg/dL — AB (ref 70–99)
Glucose-Capillary: 143 mg/dL — ABNORMAL HIGH (ref 70–99)
Glucose-Capillary: 176 mg/dL — ABNORMAL HIGH (ref 70–99)
Glucose-Capillary: 195 mg/dL — ABNORMAL HIGH (ref 70–99)
Glucose-Capillary: 223 mg/dL — ABNORMAL HIGH (ref 70–99)

## 2014-08-26 LAB — BASIC METABOLIC PANEL
Anion gap: 17 — ABNORMAL HIGH (ref 5–15)
BUN: 120 mg/dL — AB (ref 6–23)
CALCIUM: 8.8 mg/dL (ref 8.4–10.5)
CHLORIDE: 96 meq/L (ref 96–112)
CO2: 24 meq/L (ref 19–32)
CREATININE: 1.8 mg/dL — AB (ref 0.50–1.10)
GFR calc Af Amer: 31 mL/min — ABNORMAL LOW (ref >90)
GFR calc non Af Amer: 27 mL/min — ABNORMAL LOW (ref >90)
GLUCOSE: 146 mg/dL — AB (ref 70–99)
Potassium: 4.7 mEq/L (ref 3.7–5.3)
Sodium: 137 mEq/L (ref 137–147)

## 2014-08-26 LAB — CBC WITH DIFFERENTIAL/PLATELET
BASOS PCT: 0 % (ref 0–1)
Basophils Absolute: 0 10*3/uL (ref 0.0–0.1)
EOS PCT: 0 % (ref 0–5)
Eosinophils Absolute: 0 10*3/uL (ref 0.0–0.7)
HEMATOCRIT: 30.2 % — AB (ref 36.0–46.0)
HEMOGLOBIN: 9.8 g/dL — AB (ref 12.0–15.0)
LYMPHS ABS: 0.3 10*3/uL — AB (ref 0.7–4.0)
Lymphocytes Relative: 1 % — ABNORMAL LOW (ref 12–46)
MCH: 29.1 pg (ref 26.0–34.0)
MCHC: 32.5 g/dL (ref 30.0–36.0)
MCV: 89.6 fL (ref 78.0–100.0)
MONO ABS: 0.3 10*3/uL (ref 0.1–1.0)
Monocytes Relative: 1 % — ABNORMAL LOW (ref 3–12)
Neutro Abs: 28.3 10*3/uL — ABNORMAL HIGH (ref 1.7–7.7)
Neutrophils Relative %: 98 % — ABNORMAL HIGH (ref 43–77)
PLATELETS: 133 10*3/uL — AB (ref 150–400)
RBC: 3.37 MIL/uL — ABNORMAL LOW (ref 3.87–5.11)
RDW: 18 % — ABNORMAL HIGH (ref 11.5–15.5)
WBC: 28.9 10*3/uL — ABNORMAL HIGH (ref 4.0–10.5)

## 2014-08-26 LAB — PHOSPHORUS: Phosphorus: 7.5 mg/dL — ABNORMAL HIGH (ref 2.3–4.6)

## 2014-08-26 LAB — MAGNESIUM: Magnesium: 2.6 mg/dL — ABNORMAL HIGH (ref 1.5–2.5)

## 2014-08-26 MED ORDER — METHYLPREDNISOLONE SODIUM SUCC 40 MG IJ SOLR
40.0000 mg | Freq: Two times a day (BID) | INTRAMUSCULAR | Status: DC
  Administered 2014-08-26: 40 mg via INTRAVENOUS
  Filled 2014-08-26 (×4): qty 1

## 2014-08-26 MED ORDER — INFLUENZA VAC SPLIT QUAD 0.5 ML IM SUSY
0.5000 mL | PREFILLED_SYRINGE | INTRAMUSCULAR | Status: AC
  Administered 2014-08-27: 0.5 mL via INTRAMUSCULAR
  Filled 2014-08-26: qty 0.5

## 2014-08-26 MED ORDER — PNEUMOCOCCAL VAC POLYVALENT 25 MCG/0.5ML IJ INJ
0.5000 mL | INJECTION | INTRAMUSCULAR | Status: AC
  Administered 2014-08-27: 0.5 mL via INTRAMUSCULAR
  Filled 2014-08-26: qty 0.5

## 2014-08-26 MED ORDER — DEXTROSE 5 % IV SOLN
2.0000 g | Freq: Two times a day (BID) | INTRAVENOUS | Status: DC
  Administered 2014-08-26 – 2014-08-29 (×7): 2 g via INTRAVENOUS
  Filled 2014-08-26 (×8): qty 2

## 2014-08-26 NOTE — Progress Notes (Signed)
Clinical Social Worker left a message with pt's daughter listed in reference to post-acute discharge plans. [facility]. Pt discussed in progression in reference to pt returning to [facility] as a LTAC candidate. [facility]. CSW will continue to make contact with pt's daughter for discharge planning when pt is medically stable.   Brandy Gonzalez, MSW, LCSWA  [phone]  08/25/2014 3:32 PM

## 2014-08-26 NOTE — Progress Notes (Addendum)
ANTIBIOTIC CONSULT NOTE - FOLLOW UP  Pharmacy Consult for cefepime/vancomycin Indication: Pseudomonas PNA/UTI/Bacteremia  Allergies  Allergen Reactions  . Diazepam Other (See Comments)    Unknown reaction, check with patient  . Hydromorphone Other (See Comments)    Other reaction(s): Confusion (intolerance) Unknown reaction, check with patient  . Shellfish-Derived Products Other (See Comments)    All seafood   . Tape Rash    PLEASE USE EASY-RELEASE TAPE    Patient Measurements: Height: 5\' 6"  (167.6 cm) Weight: 258 lb 9.6 oz (117.3 kg) IBW/kg (Calculated) : 59.3  Vital Signs: Temp: 97.6 F (36.4 C) (10/09 0817) Temp Source: Oral (10/09 0817) BP: 132/73 mmHg (10/09 0900) Pulse Rate: 118 (10/09 0900) Intake/Output from previous day: 10/08 0701 - 10/09 0700 In: 2367 [I.V.:1185; NG/GT:365; IV Piggyback:667] Out: 1796 [Urine:1795; Stool:1] Intake/Output from this shift: Total I/O In: 193 [NG/GT:110; IV Piggyback:83] Out: 125 [Urine:125]  Labs:  Recent Labs  08/24/14 0410 08/24/14 1724 08/25/14 0355 08/26/14 0430  WBC 18.8*  --  19.6* 28.9*  HGB 6.5*  --  8.3* 9.8*  PLT 139*  --  127* 133*  CREATININE 1.56* 1.62* 1.68* 1.80*   Estimated Creatinine Clearance: 36.3 ml/min (by C-G formula based on Cr of 1.8). No results found for this basename: VANCOTROUGH, Leodis BinetVANCOPEAK, VANCORANDOM, GENTTROUGH, GENTPEAK, GENTRANDOM, TOBRATROUGH, TOBRAPEAK, TOBRARND, AMIKACINPEAK, AMIKACINTROU, AMIKACIN,  in the last 72 hours   Microbiology: Recent Results (from the past 720 hour(s))  CULTURE, EXPECTORATED SPUTUM-ASSESSMENT     Status: None   Collection Time    08/24/14 12:50 AM      Result Value Ref Range Status   Specimen Description SPUTUM   Final   Special Requests Normal   Final   Sputum evaluation     Final   Value: THIS SPECIMEN IS ACCEPTABLE. RESPIRATORY CULTURE REPORT TO FOLLOW.   Report Status 08/24/2014 FINAL   Final  CULTURE, RESPIRATORY (NON-EXPECTORATED)     Status:  None   Collection Time    08/24/14 12:50 AM      Result Value Ref Range Status   Specimen Description SPUTUM   Final   Special Requests NONE   Final   Gram Stain     Final   Value: MODERATE WBC PRESENT,BOTH PMN AND MONONUCLEAR     RARE SQUAMOUS EPITHELIAL CELLS PRESENT     RARE GRAM NEGATIVE RODS     Performed at [facility]   Culture     Final   Value: ABUNDANT PSEUDOMONAS AERUGINOSA     Performed at [facility]   Report Status PENDING   Incomplete   Organism ID, Bacteria PSEUDOMONAS AERUGINOSA   Final  CULTURE, BLOOD (ROUTINE X 2)     Status: None   Collection Time    08/24/14  1:00 AM      Result Value Ref Range Status   Specimen Description BLOOD LEFT ARM   Final   Special Requests BOTTLES DRAWN AEROBIC ONLY 5CC   Final   Culture  Setup Time     Final   Value: 08/24/2014 10:30     Performed at [facility]   Culture     Final   Value: PSEUDOMONAS AERUGINOSA     Note: Gram Stain Report Called to,Read Back By and Verified With: GREGG HARDUK AT 2:28 A.M. ON 08/25/14 WARRB     Performed at [facility]   Report Status PENDING   Incomplete  URINE CULTURE     Status: None   Collection  Time    08/24/14  1:23 AM      Result Value Ref Range Status   Specimen Description URINE, CATHETERIZED   Final   Special Requests NONE   Final   Culture  Setup Time     Final   Value: 08/24/2014 10:40     Performed at [facility] Count     Final   Value: 80,000 COLONIES/ML     Performed at [facility]   Culture     Final   Value: PSEUDOMONAS AERUGINOSA     Performed at [facility]   Report Status PENDING   Incomplete  CULTURE, BLOOD (ROUTINE X 2)     Status: None   Collection Time    08/24/14  1:30 AM      Result Value Ref Range Status   Specimen Description BLOOD LEFT HAND   Final   Special Requests BOTTLES DRAWN AEROBIC AND ANAEROBIC 5CC EACH   Final   Culture  Setup Time     Final   Value: 08/24/2014 10:12      Performed at [facility]   Culture     Final   Value: PSEUDOMONAS AERUGINOSA     Note: Gram Stain Report Called to,Read Back By and Verified With: GREGG HARDUK AT 1:39 A.M. ON 08/25/14 WARRB     Performed at [facility]   Report Status PENDING   Incomplete  MRSA PCR SCREENING     Status: Abnormal   Collection Time    08/24/14  6:06 AM      Result Value Ref Range Status   MRSA by PCR POSITIVE (*) NEGATIVE Final   Comment:            The GeneXpert MRSA Assay (FDA     approved for NASAL specimens     only), is one component of a     comprehensive MRSA colonization     surveillance program. It is not     intended to diagnose MRSA     infection nor to guide or     monitor treatment for     MRSA infections.     RESULT CALLED TO, READ BACK BY AND VERIFIED WITH:     MACASSERRA,M RN @ [phone] 08/24/14 LEONARDA,  CLOSTRIDIUM DIFFICILE BY PCR     Status: None   Collection Time    08/24/14  5:03 PM      Result Value Ref Range Status   C difficile by pcr NEGATIVE  NEGATIVE Final    Anti-infectives   Start     Dose/Rate Route Frequency Ordered Stop   08/26/14 1000  ceFEPIme (MAXIPIME) 2 g in dextrose 5 % 50 mL IVPB     2 g 100 mL/hr over 30 Minutes Intravenous Every 12 hours 08/26/14 0910     08/25/14 1145  levofloxacin (LEVAQUIN) IVPB 750 mg  Status:  Discontinued     750 mg 100 mL/hr over 90 Minutes Intravenous Every 48 hours 08/25/14 1120 08/26/14 0905   08/25/14 1145  meropenem (MERREM) 1 g in sodium chloride 0.9 % 100 mL IVPB  Status:  Discontinued     1 g 200 mL/hr over 30 Minutes Intravenous Every 12 hours 08/25/14 1120 08/26/14 0905   08/25/14 0600  vancomycin (VANCOCIN) 1,250 mg in sodium chloride 0.9 % 250 mL IVPB     1,250 mg 166.7 mL/hr over 90 Minutes Intravenous Every 24 hours 08/24/14 0310     08/24/14  2200  azithromycin (ZITHROMAX) 500 mg in dextrose 5 % 250 mL IVPB  Status:  Discontinued     500 mg 250 mL/hr over 60 Minutes Intravenous Every 24 hours  08/24/14 0310 08/25/14 1117   08/24/14 1000  piperacillin-tazobactam (ZOSYN) IVPB 3.375 g  Status:  Discontinued     3.375 g 12.5 mL/hr over 240 Minutes Intravenous Every 8 hours 08/24/14 0310 08/25/14 1120   08/24/14 0115  levofloxacin (LEVAQUIN) IVPB 750 mg  Status:  Discontinued     750 mg 100 mL/hr over 90 Minutes Intravenous  Once 08/24/14 0112 08/24/14 0112   08/24/14 0115  azithromycin (ZITHROMAX) 500 mg in dextrose 5 % 250 mL IVPB     500 mg 250 mL/hr over 60 Minutes Intravenous  Once 08/24/14 0112 08/24/14 0349   08/24/14 0030  vancomycin (VANCOCIN) 1,500 mg in sodium chloride 0.9 % 500 mL IVPB     1,500 mg 250 mL/hr over 120 Minutes Intravenous  Once 09/08/2014 2351 08/24/14 0349   08/24/14 0000  piperacillin-tazobactam (ZOSYN) IVPB 3.375 g     3.375 g 100 mL/hr over 30 Minutes Intravenous  Once 09/11/2014 2351 08/24/14 0348      Assessment: 73 yo f admitted on 10/7 from [facility] for acute on chronic respiratory failure.  Patient has a complicated history consisting of Citrobacter, Klebsiella, Enterococcus PNA/Bacteremia and PNA MDR Acinetobacter. Pharmacy is consulted to dose antibiotics. Patient was initially on vancomycin, zosyn and azithromycin. Due to patient's history of MDR organisms, her coverage was broadened to levofloxacin 750 mg IV q48hr and meropenem 1gm IV q12hr on 10/8. Cultures came back this morning showing pseudomonas in her urine, respiratory culture and blood x 2 (intermediate to cipro, resistant to imipenem).  Patient was transitioned to cefepime and vancomycin. Wbc 28.9, tmx 98.3, SCr 1.80 (increased from 1.68), CrCl ~36 ml/min.  Goal of Therapy:  Vancomycin trough level 15-20 mcg/ml Eradication of infection  Plan:  Discontinue levofloxacin and meropenem Begin cefepime 2gm IV q12hr Continue vancomycin 1,250 mg IV q24hr VT at Css De-escalate antibiotics further when cultures are final Monitor CBC, temperature curve, C&S, renal function, clinical  course  Cassie L. Roseanne RenoStewart, PharmD Clinical Pharmacy Resident Pager: [phone] 08/26/2014 12:03 PM
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
## 2014-08-27 DIAGNOSIS — A419 Sepsis, unspecified organism: Secondary | ICD-10-CM | POA: Diagnosis not present

## 2014-08-27 DIAGNOSIS — J9620 Acute and chronic respiratory failure, unspecified whether with hypoxia or hypercapnia: Secondary | ICD-10-CM

## 2014-08-27 DIAGNOSIS — N170 Acute kidney failure with tubular necrosis: Secondary | ICD-10-CM

## 2014-08-27 LAB — CBC WITH DIFFERENTIAL/PLATELET
BASOS PCT: 0 % (ref 0–1)
Basophils Absolute: 0 10*3/uL (ref 0.0–0.1)
EOS PCT: 0 % (ref 0–5)
Eosinophils Absolute: 0 10*3/uL (ref 0.0–0.7)
HCT: 29.1 % — ABNORMAL LOW (ref 36.0–46.0)
HEMOGLOBIN: 9.3 g/dL — AB (ref 12.0–15.0)
LYMPHS ABS: 0.4 10*3/uL — AB (ref 0.7–4.0)
Lymphocytes Relative: 2 % — ABNORMAL LOW (ref 12–46)
MCH: 28.4 pg (ref 26.0–34.0)
MCHC: 32 g/dL (ref 30.0–36.0)
MCV: 89 fL (ref 78.0–100.0)
Monocytes Absolute: 0.4 10*3/uL (ref 0.1–1.0)
Monocytes Relative: 2 % — ABNORMAL LOW (ref 3–12)
NEUTROS ABS: 16.7 10*3/uL — AB (ref 1.7–7.7)
Neutrophils Relative %: 96 % — ABNORMAL HIGH (ref 43–77)
Platelets: 103 10*3/uL — ABNORMAL LOW (ref 150–400)
RBC: 3.27 MIL/uL — AB (ref 3.87–5.11)
RDW: 18.2 % — ABNORMAL HIGH (ref 11.5–15.5)
WBC Morphology: INCREASED
WBC: 17.5 10*3/uL — ABNORMAL HIGH (ref 4.0–10.5)

## 2014-08-27 LAB — BASIC METABOLIC PANEL
Anion gap: 18 — ABNORMAL HIGH (ref 5–15)
BUN: 138 mg/dL — ABNORMAL HIGH (ref 6–23)
CALCIUM: 9 mg/dL (ref 8.4–10.5)
CO2: 25 mEq/L (ref 19–32)
Chloride: 98 mEq/L (ref 96–112)
Creatinine, Ser: 1.89 mg/dL — ABNORMAL HIGH (ref 0.50–1.10)
GFR calc Af Amer: 29 mL/min — ABNORMAL LOW (ref >90)
GFR, EST NON AFRICAN AMERICAN: 25 mL/min — AB (ref >90)
GLUCOSE: 247 mg/dL — AB (ref 70–99)
Potassium: 4.2 mEq/L (ref 3.7–5.3)
Sodium: 141 mEq/L (ref 137–147)

## 2014-08-27 LAB — POCT I-STAT 3, ART BLOOD GAS (G3+)
Bicarbonate: 26.7 mEq/L — ABNORMAL HIGH (ref 20.0–24.0)
O2 Saturation: 97 %
PH ART: 7.301 — AB (ref 7.350–7.450)
Patient temperature: 98.1
TCO2: 28 mmol/L (ref 0–100)
pCO2 arterial: 54.1 mmHg — ABNORMAL HIGH (ref 35.0–45.0)
pO2, Arterial: 98 mmHg (ref 80.0–100.0)

## 2014-08-27 LAB — CULTURE, BLOOD (ROUTINE X 2)

## 2014-08-27 LAB — MAGNESIUM: Magnesium: 2.5 mg/dL (ref 1.5–2.5)

## 2014-08-27 LAB — URINE CULTURE: Colony Count: 80000

## 2014-08-27 LAB — CULTURE, RESPIRATORY W GRAM STAIN

## 2014-08-27 LAB — GLUCOSE, CAPILLARY
Glucose-Capillary: 263 mg/dL — ABNORMAL HIGH (ref 70–99)
Glucose-Capillary: 235 mg/dL — ABNORMAL HIGH (ref 70–99)
Glucose-Capillary: 250 mg/dL — ABNORMAL HIGH (ref 70–99)
Glucose-Capillary: 220 mg/dL — ABNORMAL HIGH (ref 70–99)
Glucose-Capillary: 208 mg/dL — ABNORMAL HIGH (ref 70–99)

## 2014-08-27 LAB — CULTURE, RESPIRATORY

## 2014-08-27 LAB — PHOSPHORUS: Phosphorus: 8.5 mg/dL — ABNORMAL HIGH (ref 2.3–4.6)

## 2014-08-27 MED ORDER — FENTANYL CITRATE 0.05 MG/ML IJ SOLN
25.0000 ug | INTRAMUSCULAR | Status: DC | PRN
  Administered 2014-08-27 – 2014-08-29 (×3): 50 ug via INTRAVENOUS
  Filled 2014-08-27 (×4): qty 2

## 2014-08-27 MED ORDER — FAMOTIDINE 40 MG/5ML PO SUSR
20.0000 mg | Freq: Every day | ORAL | Status: DC
  Administered 2014-08-27 – 2014-08-29 (×3): 20 mg
  Filled 2014-08-27 (×3): qty 2.5

## 2014-08-27 MED ORDER — FUROSEMIDE 10 MG/ML PO SOLN
40.0000 mg | Freq: Two times a day (BID) | ORAL | Status: DC
  Administered 2014-08-27 – 2014-08-29 (×5): 40 mg via ORAL
  Filled 2014-08-27 (×6): qty 4

## 2014-08-27 MED ORDER — METHYLPREDNISOLONE SODIUM SUCC 40 MG IJ SOLR
40.0000 mg | INTRAMUSCULAR | Status: AC
  Administered 2014-08-28: 40 mg via INTRAVENOUS
  Filled 2014-08-27: qty 1

## 2014-08-27 MED ORDER — FUROSEMIDE 10 MG/ML IJ SOLN
INTRAMUSCULAR | Status: AC
  Administered 2014-08-27: 40 mg
  Filled 2014-08-27: qty 4

## 2014-08-27 MED ORDER — INSULIN ASPART 100 UNIT/ML ~~LOC~~ SOLN
0.0000 [IU] | SUBCUTANEOUS | Status: DC
  Administered 2014-08-27: 8 [IU] via SUBCUTANEOUS
  Administered 2014-08-27 – 2014-08-28 (×5): 5 [IU] via SUBCUTANEOUS
  Administered 2014-08-28: 3 [IU] via SUBCUTANEOUS
  Administered 2014-08-28: 8 [IU] via SUBCUTANEOUS
  Administered 2014-08-28 (×3): 3 [IU] via SUBCUTANEOUS
  Administered 2014-08-28: 2 [IU] via SUBCUTANEOUS
  Administered 2014-08-29 (×2): 5 [IU] via SUBCUTANEOUS

## 2014-08-27 MED ORDER — METOPROLOL TARTRATE 25 MG PO TABS
25.0000 mg | ORAL_TABLET | Freq: Two times a day (BID) | ORAL | Status: DC
  Administered 2014-08-27 – 2014-08-29 (×5): 25 mg via ORAL
  Filled 2014-08-27 (×6): qty 1

## 2014-08-27 NOTE — Progress Notes (Signed)
08/27/2014 04:30 Patient placed supine at 2a, when laying patient flat and turing patient to R-side for the q2 hour turn, patient's SpO2 dropped into the low 80s. HOB increased back to >30%, FiO2 increased to 100%, and tracheal suctioning performed. Despite prior attempted interventions, SpO2 continued to dropper further into the mid 70s where RT was call and assisted with ventilation using a BVM. Once O2 > 92%, RT changed inline suctioning, placed patient back on ventilator support, and suctioned a moderate amount of bright red blood from trach. Old inline suctioning noted to have what looked like a blood clot vs lung tissue occluding inline suction. Will continue close observation for further desats and/or bleeding. BShepherd, RN
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
## 2014-08-27 NOTE — Progress Notes (Signed)
Pt desaturated in to the 70's. RT bagged Pt until SATs came back up. When  RT suctioned Pt bight red blood in moderate amounts came out. There was tissue clogging the suction catheter, it looked like possible lung tissue. RN aware and RT will continue to monitor.

## 2014-08-28 LAB — CBC WITH DIFFERENTIAL/PLATELET
BASOS PCT: 0 % (ref 0–1)
Basophils Absolute: 0 10*3/uL (ref 0.0–0.1)
EOS PCT: 0 % (ref 0–5)
Eosinophils Absolute: 0 10*3/uL (ref 0.0–0.7)
HEMATOCRIT: 27.9 % — AB (ref 36.0–46.0)
Hemoglobin: 8.9 g/dL — ABNORMAL LOW (ref 12.0–15.0)
Lymphocytes Relative: 3 % — ABNORMAL LOW (ref 12–46)
Lymphs Abs: 0.4 10*3/uL — ABNORMAL LOW (ref 0.7–4.0)
MCH: 27.9 pg (ref 26.0–34.0)
MCHC: 31.9 g/dL (ref 30.0–36.0)
MCV: 87.5 fL (ref 78.0–100.0)
MONO ABS: 0.6 10*3/uL (ref 0.1–1.0)
MONOS PCT: 4 % (ref 3–12)
Neutro Abs: 14 10*3/uL — ABNORMAL HIGH (ref 1.7–7.7)
Neutrophils Relative %: 94 % — ABNORMAL HIGH (ref 43–77)
Platelets: 92 10*3/uL — ABNORMAL LOW (ref 150–400)
RBC: 3.19 MIL/uL — ABNORMAL LOW (ref 3.87–5.11)
RDW: 17.9 % — AB (ref 11.5–15.5)
WBC: 14.9 10*3/uL — ABNORMAL HIGH (ref 4.0–10.5)

## 2014-08-28 LAB — MAGNESIUM: MAGNESIUM: 2.3 mg/dL (ref 1.5–2.5)

## 2014-08-28 LAB — BASIC METABOLIC PANEL
Anion gap: 18 — ABNORMAL HIGH (ref 5–15)
BUN: 147 mg/dL — ABNORMAL HIGH (ref 6–23)
CO2: 25 mEq/L (ref 19–32)
CREATININE: 1.76 mg/dL — AB (ref 0.50–1.10)
Calcium: 8.2 mg/dL — ABNORMAL LOW (ref 8.4–10.5)
Chloride: 101 mEq/L (ref 96–112)
GFR calc non Af Amer: 28 mL/min — ABNORMAL LOW (ref >90)
GFR, EST AFRICAN AMERICAN: 32 mL/min — AB (ref >90)
Glucose, Bld: 186 mg/dL — ABNORMAL HIGH (ref 70–99)
POTASSIUM: 3.5 meq/L — AB (ref 3.7–5.3)
Sodium: 144 mEq/L (ref 137–147)

## 2014-08-28 LAB — GLUCOSE, CAPILLARY
GLUCOSE-CAPILLARY: 123 mg/dL — AB (ref 70–99)
GLUCOSE-CAPILLARY: 199 mg/dL — AB (ref 70–99)
Glucose-Capillary: 154 mg/dL — ABNORMAL HIGH (ref 70–99)
Glucose-Capillary: 167 mg/dL — ABNORMAL HIGH (ref 70–99)
Glucose-Capillary: 178 mg/dL — ABNORMAL HIGH (ref 70–99)
Glucose-Capillary: 222 mg/dL — ABNORMAL HIGH (ref 70–99)
Glucose-Capillary: 242 mg/dL — ABNORMAL HIGH (ref 70–99)

## 2014-08-28 LAB — PHOSPHORUS: Phosphorus: 7.6 mg/dL — ABNORMAL HIGH (ref 2.3–4.6)

## 2014-08-28 MED ORDER — SACCHAROMYCES BOULARDII 250 MG PO CAPS
250.0000 mg | ORAL_CAPSULE | Freq: Two times a day (BID) | ORAL | Status: DC
  Administered 2014-08-28 – 2014-08-29 (×2): 250 mg via ORAL
  Filled 2014-08-28 (×3): qty 1
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
## 2014-08-29 ENCOUNTER — Encounter (HOSPITAL_COMMUNITY): Payer: Self-pay | Admitting: *Deleted

## 2014-08-29 ENCOUNTER — Inpatient Hospital Stay (HOSPITAL_COMMUNITY): Payer: Medicare Other

## 2014-08-29 DIAGNOSIS — A419 Sepsis, unspecified organism: Secondary | ICD-10-CM

## 2014-08-29 DIAGNOSIS — N170 Acute kidney failure with tubular necrosis: Secondary | ICD-10-CM

## 2014-08-29 DIAGNOSIS — J9621 Acute and chronic respiratory failure with hypoxia: Secondary | ICD-10-CM

## 2014-08-29 LAB — GLUCOSE, CAPILLARY
GLUCOSE-CAPILLARY: 220 mg/dL — AB (ref 70–99)
Glucose-Capillary: 222 mg/dL — ABNORMAL HIGH (ref 70–99)
Glucose-Capillary: 223 mg/dL — ABNORMAL HIGH (ref 70–99)

## 2014-08-29 LAB — BASIC METABOLIC PANEL
Anion gap: 16 — ABNORMAL HIGH (ref 5–15)
BUN: 153 mg/dL — ABNORMAL HIGH (ref 6–23)
CO2: 27 mEq/L (ref 19–32)
CREATININE: 1.68 mg/dL — AB (ref 0.50–1.10)
Calcium: 8.5 mg/dL (ref 8.4–10.5)
Chloride: 105 mEq/L (ref 96–112)
GFR, EST AFRICAN AMERICAN: 34 mL/min — AB (ref >90)
GFR, EST NON AFRICAN AMERICAN: 29 mL/min — AB (ref >90)
Glucose, Bld: 232 mg/dL — ABNORMAL HIGH (ref 70–99)
POTASSIUM: 3.4 meq/L — AB (ref 3.7–5.3)
Sodium: 148 mEq/L — ABNORMAL HIGH (ref 137–147)

## 2014-08-29 LAB — CBC
HEMATOCRIT: 28.3 % — AB (ref 36.0–46.0)
Hemoglobin: 8.9 g/dL — ABNORMAL LOW (ref 12.0–15.0)
MCH: 28.6 pg (ref 26.0–34.0)
MCHC: 31.4 g/dL (ref 30.0–36.0)
MCV: 91 fL (ref 78.0–100.0)
Platelets: 81 10*3/uL — ABNORMAL LOW (ref 150–400)
RBC: 3.11 MIL/uL — ABNORMAL LOW (ref 3.87–5.11)
RDW: 18.1 % — ABNORMAL HIGH (ref 11.5–15.5)
WBC: 11.2 10*3/uL — ABNORMAL HIGH (ref 4.0–10.5)

## 2014-08-29 LAB — PHOSPHORUS: Phosphorus: 7.9 mg/dL — ABNORMAL HIGH (ref 2.3–4.6)

## 2014-08-29 LAB — PROTIME-INR
INR: 1.31 (ref 0.00–1.49)
PROTHROMBIN TIME: 16.3 s — AB (ref 11.6–15.2)

## 2014-08-29 LAB — APTT: APTT: 28 s (ref 24–37)

## 2014-08-29 MED ORDER — MORPHINE SULFATE 25 MG/ML IV SOLN
10.0000 mg/h | INTRAVENOUS | Status: DC
  Filled 2014-08-29: qty 20

## 2014-08-29 MED ORDER — DEXTROSE 5 % IV SOLN
INTRAVENOUS | Status: DC
  Administered 2014-08-29: 13:00:00 via INTRAVENOUS

## 2014-08-29 MED ORDER — FUROSEMIDE 40 MG PO TABS
40.0000 mg | ORAL_TABLET | Freq: Two times a day (BID) | ORAL | Status: DC
  Filled 2014-08-29 (×2): qty 1

## 2014-08-29 MED ORDER — FENTANYL 75 MCG/HR TD PT72
75.0000 ug | MEDICATED_PATCH | TRANSDERMAL | Status: DC
  Administered 2014-08-29: 75 ug via TRANSDERMAL
  Filled 2014-08-29: qty 1

## 2014-08-29 MED ORDER — MORPHINE SULFATE 10 MG/ML IJ SOLN
10.0000 mg/h | INTRAVENOUS | Status: DC
  Administered 2014-08-29: 20 mg/h via INTRAVENOUS
  Filled 2014-08-29 (×2): qty 10

## 2014-08-29 MED ORDER — MORPHINE BOLUS VIA INFUSION
5.0000 mg | INTRAVENOUS | Status: DC | PRN
  Administered 2014-08-29 (×2): 20 mg via INTRAVENOUS
  Filled 2014-08-29: qty 20

## 2014-08-29 MED ORDER — POTASSIUM CHLORIDE 20 MEQ/15ML (10%) PO LIQD
40.0000 meq | Freq: Once | ORAL | Status: AC
  Administered 2014-08-29: 40 meq
  Filled 2014-08-29: qty 30

## 2014-09-04 LAB — CULTURE, BLOOD (ROUTINE X 2)
Culture: NO GROWTH
Culture: NO GROWTH
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
## 2014-09-18 NOTE — Progress Notes (Signed)
eLink Physician-Brief Progress Note Patient Name: Brandy MuldersMaxine Gonzalez DOB: 1941-08-30 MRN: [id number]   Date of Service  08/30/2014  HPI/Events of Note    eICU Interventions  Hypokalemia -repleted      Intervention Category Intermediate Interventions: Electrolyte abnormality - evaluation and management  [name] V. 09/09/2014, 5:47 AM

## 2014-09-18 NOTE — Progress Notes (Signed)
Chaplain responded to page that family is ready to withdraw life support for pt.  Family was gathered at bedside and played music for pt.  Daughter was talking to pt and holding pt's hand.  Family shared stories about pt and found comfort in talking about what pt and other family members would be doing together in heaven.  Chaplain offered the family a prayer shawl which was placed over pt.  Family remained in room until pt pronounced then moved to conference room.  Chaplain provided emotional and spiritual support as well as ministry of hospitality, presence and empathetic listening.    08/20/2014 1700  Clinical Encounter Type  Visited With Patient and family together;Health care provider  Visit Type Follow-up;Spiritual support;Social support;Critical Care;Death;Patient actively dying  Referral From Nurse  Spiritual Encounters  Spiritual Needs Prayer;Ritual;Emotional;Grief support;Other (Comment) (Prayer Shawl given to pt.)  Stress Factors  Patient Stress Factors None identified  Family Stress Factors Exhausted;Family relationships;Health changes;Loss;Major life changes  Advance Directives (For Healthcare)  Does patient have an advance directive? No   Erroll Lunavercash, Sina Sumpter A, Chaplain

## 2014-09-18 NOTE — Procedures (Signed)
Extubation Procedure Note  Patient Details:   Name: Brandy Gonzalez DOB: 1941-01-02 MRN: [id number]   Airway Documentation:     Evaluation  O2 sats: Terminal extubation to room air  Complications: No apparent complications Patient did not tolerate procedure well. Bilateral Breath Sounds: Diminished Suctioning: Airway No  [name], Ryland Tungate Michelle 09/01/2014, 5:14 PM

## 2014-09-18 NOTE — Progress Notes (Signed)
After watching patient on SBT she was removed form ventilator with help of RT.  Stayed with patient and family to continue to assess and offer support.  Oxygen levels dropped quickly and HR stayed stable.  Patient comfortable on bedside assessment. Will continue to assess for comfort and offer support to patient and family.  Jacqulyn Canehristopher Scott Kherington Meraz RN, BSN, CCRN
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
## 2014-09-18 NOTE — Progress Notes (Signed)
Participated in conversation with Dr. Tyson AliasFeinstein, Patient's daughter Lupita LeashDonna, Patient's sister, and chaplin.  Dr. Tyson AliasFeinstein provided medical update and offered support to the family.  During this meeting it was decided to change plan of care for the patient to focus on comfort measures only.  We discussed starting a morphine infusion and once comfortable removing patient from the ventilator.  Lupita LeashDonna and her aunt reported they supported this plan of care.  Support has continued to be offered to Lupita LeashDonna, her aunt and other family throughout the day.    Jacqulyn Canehristopher Scott Marynell Bies RN, BSN, CCRN
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
## 2014-09-18 NOTE — Progress Notes (Signed)
Patient ID: Colin MuldersMaxine Gonzalez, female   DOB: Nov 27, 1940, 73 y.o.   MRN: [id number]  I have had extensive discussions with family daughter and sister. We discussed patients current circumstances and organ failures. We also discussed patient's prior wishes under circumstances such as this. Family has decided to NOT perform resuscitation. We discussed the poor prognosis and likely poor quality of life. Family has decided to offer full comfort care. They are aware that the patient may be transferred to palliative care floor for continued comfort care needs. They have been fully updated on the process and expectations.  To RA, will require high doses mophrine  Mcarthur Rossettianiel J. Tyson AliasFeinstein, MD, FACP Pgr: [phone] [facility]

## 2014-09-18 NOTE — Progress Notes (Addendum)
17:18 Patient Asystole on monitor at bedside with family and chaplin pete.  No heart sounds or breath sounds on auscultation & pupils non-reactive to light.  These findings confirmed by second RN Hermelinda MedicusIrekia Baynes. Made daughter Lupita LeashDonna aware of these findings and continued to offer support.  Made Dr. Tyson AliasFeinstein aware and called [facility] to notify of time of death. No belongings to return to the family. 15ml of morphine infusion wasted in sink witnessed by Irving BurtonIrekia Baynes RN.   Jacqulyn Canehristopher Scott Chaske Paskett RN, BSN, CCRN

## 2014-09-18 NOTE — Progress Notes (Signed)
11:30AM Made contact with patient daughter via phone call.  Provided update to Lupita LeashDonna and requested she come into speak to the physician today about her mothers plan of care.  I provided and update on her current condition and shared my concerns that her mother was in pain.  I offered support and spent about 30 minutes on the phone with her.  Lupita LeashDonna agreed to come to the hospital; today and speak with Dr. Tyson AliasFeinstein at 2 pm.  I made Dr. Tyson AliasFeinstein aware of this conversation.  Jacqulyn Canehristopher Scott Nikka Hakimian RN, BSN, CCRN

## 2014-09-18 NOTE — Progress Notes (Signed)
Inpatient Diabetes Program Recommendations  AACE/ADA: New Consensus Statement on Inpatient Glycemic Control (2013)  Target Ranges:  Prepandial:   less than 140 mg/dL      Peak postprandial:   less than 180 mg/dL (1-2 hours)      Critically ill patients:  140 - 180 mg/dL   Results for Colin MuldersOTTS, Dalana (MRN [id number]) as of 08/28/2014 10:35  Ref. Range 08/28/2014 11:26 08/28/2014 15:29 08/28/2014 19:58 08/28/2014 23:44 09/17/2014 03:29 09/16/2014 07:22  Glucose-Capillary Latest Range: 70-99 mg/dL 147167 (H) 829123 (H) 562242 (H) 222 (H) 222 (H) 223 (H)    Diabetes history: No Outpatient Diabetes medications: NA Current orders for Inpatient glycemic control: Novolog 0-15 units Q4H  Inpatient Diabetes Program Recommendations  Insulin - Basal: If CBGs continue to be >180 mg with tube feeding coverage, may want to consider ordering low dose basal insulin. Insulin - Tube Feeding Coverage: Please consider ordering Novolog 3 units Q4H for tube feeding coverage (in addition to Novolog correction scale).  Note: Patient has no documented history of diabetes. CBGs ranged from 123-242 mg/dl on 13/06/6509/11/15 and have been 222/222/223 mg/dl today. Noted steroids have been stopped and last dose of Solumedrol 40 mg was given on 08/28/14 at 8:06am. Patient continues on Vital @ 55 ml/hr and noted D5 @ 40 ml/hr was ordered this morning. Please consider ordering tube feeding coverage and may want to consider ordering low dose basal if CBGs continue to be elevated with tube feeding coverage.  Thanks, Orlando PennerMarie Ticara Waner, RN, MSN, CCRN Diabetes Coordinator Inpatient Diabetes Program [phone] (Team Pager) [phone] ([facility] office) [phone] [facility] office)

## 2014-09-18 NOTE — Progress Notes (Signed)
Chaplain responded to page from MD for consult with family regarding withdrawal of life support for pt.  Chaplain provided emotional support as well as comfort and ministry of presence.  Family decided to withdraw life support later today.  Chaplain will continue to follow up with family.  Family expressed concern over family dynamics, chaplain will continue to be a resource and advocate for family throughout process.  08/28/2014 1430  Clinical Encounter Type  Visited With Patient and family together;Health care provider  Visit Type Initial;Spiritual support;Social support;Critical Care  Referral From Physician  Spiritual Encounters  Spiritual Needs Prayer;Emotional;Grief support  Stress Factors  Patient Stress Factors Exhausted;Health changes;Major life changes  Family Stress Factors Exhausted;Family relationships;Health changes;Loss;Major life changes  Advance Directives (For Healthcare)  Does patient have an advance directive? No  Erroll Lunavercash, Vartan Kerins A, Chaplain

## 2014-09-18 DEATH — deceased

## 2015-01-16 IMAGING — CR DG CHEST 1V PORT
1 series · 1 of 1 positions shown · non-contrast
Comparison: Chest radiograph 08/25/2014

CLINICAL DATA: Acute on chronic respiratory failure. Best
obtainable image.

EXAM:
CHEST - 1 VIEW

[AP]
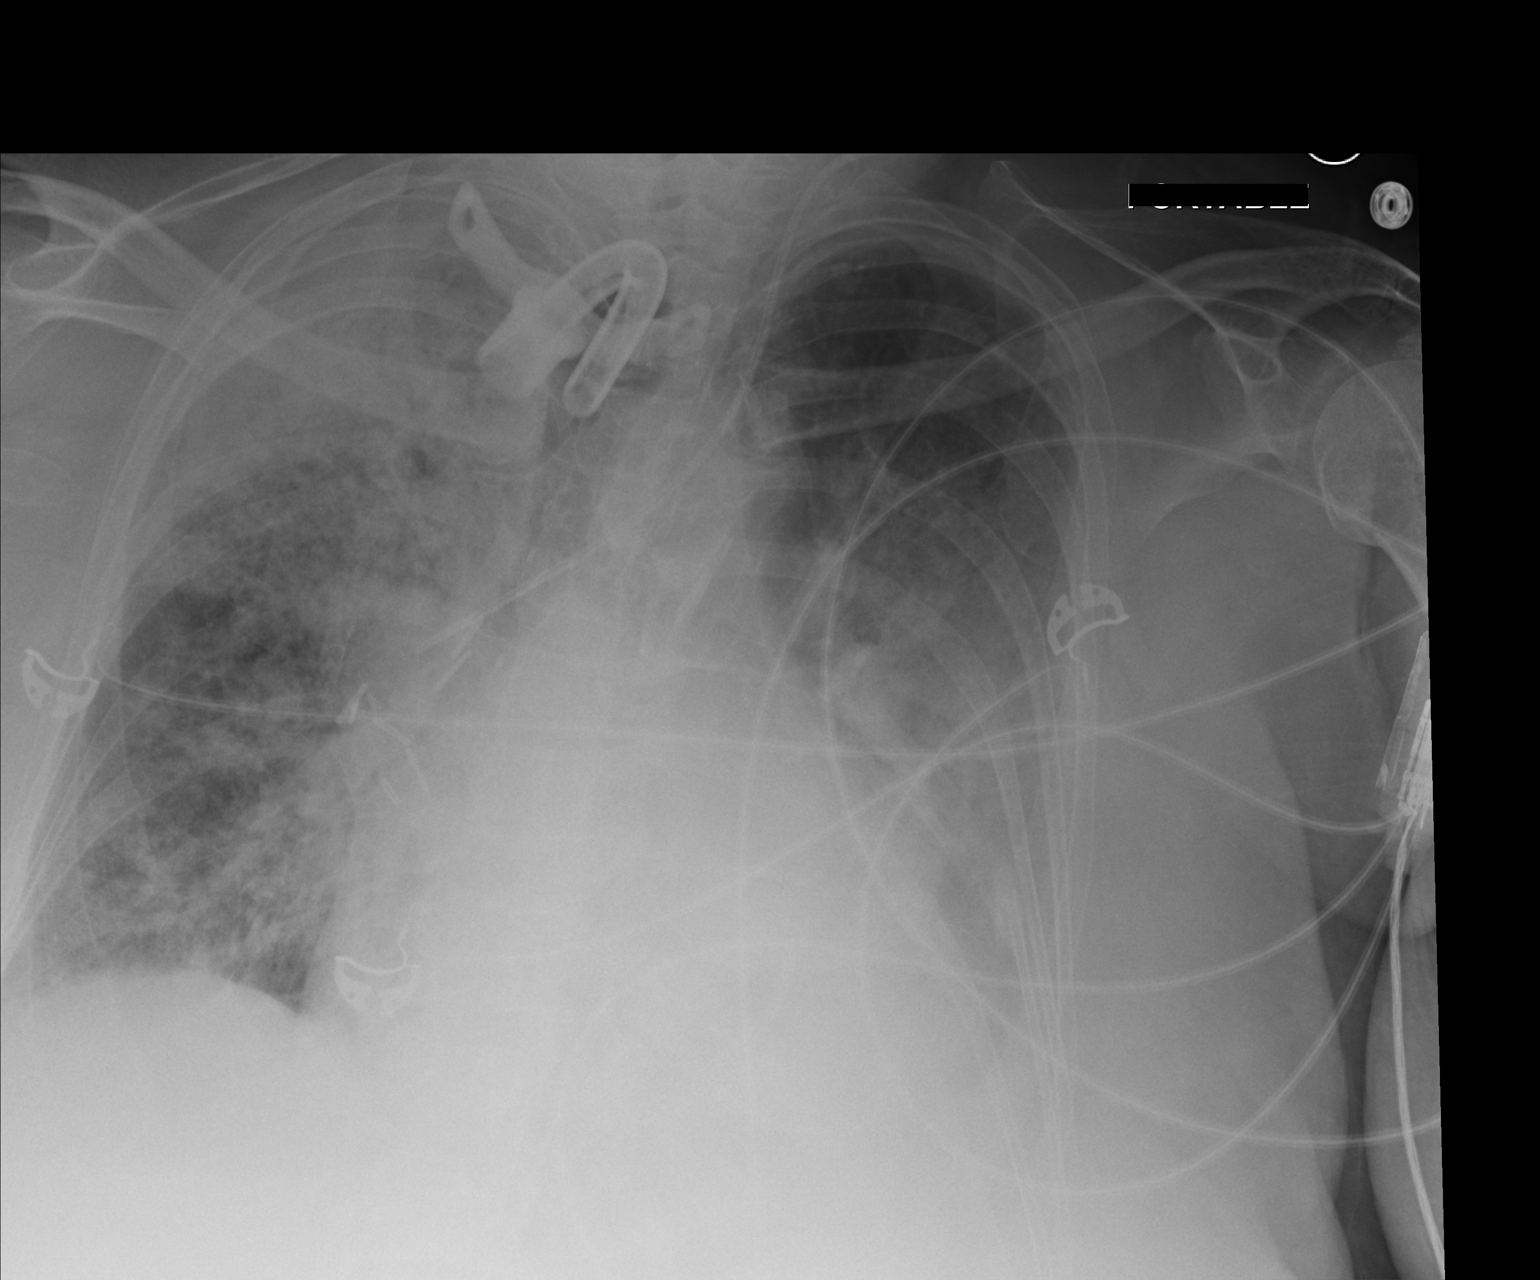

[1 of 1 positions shown; findings below may reference images not displayed]

FINDINGS: Tracheostomy tube terminates within the mid trachea, stable. Left
internal jugular approach central venous catheter tip projects over
the superior vena cava. Multiple leads overlie the patient. Stable
enlarged cardiac and mediastinal contours. Persistent consolidative
opacity within the right mid and upper hemi thorax. Small to
moderate layering left pleural effusion with underlying
heterogeneous pulmonary opacities.
IMPRESSION: Grossly stable chest radiograph with layering left pleural effusion
and underlying consolidation suggestive of atelectasis.

Persistent consolidation within the right mid and upper hemi thorax.
